# Patient Record
Sex: Male | Born: 1990 | Hispanic: No | Marital: Married | Smoking: Never smoker
Health system: Southern US, Community
[De-identification: ages and names within clinical notes are randomized; demographics above are authoritative.]

## PROBLEM LIST (undated history)

## (undated) DIAGNOSIS — Z8613 Personal history of malaria: Secondary | ICD-10-CM

## (undated) HISTORY — DX: Personal history of malaria: Z86.13

---

## 2019-11-20 DIAGNOSIS — Z0289 Encounter for other administrative examinations: Secondary | ICD-10-CM | POA: Insufficient documentation

## 2019-11-21 ENCOUNTER — Other Ambulatory Visit: Payer: Self-pay

## 2019-11-21 ENCOUNTER — Other Ambulatory Visit (HOSPITAL_COMMUNITY)
Admission: RE | Admit: 2019-11-21 | Discharge: 2019-11-21 | Disposition: A | Discharge status: Home / Self Care | Payer: Medicaid Other | Source: Ambulatory Visit | Attending: Family Medicine | Admitting: Family Medicine

## 2019-11-21 ENCOUNTER — Ambulatory Visit (INDEPENDENT_AMBULATORY_CARE_PROVIDER_SITE_OTHER): Payer: Medicaid Other | Admitting: Family Medicine

## 2019-11-21 VITALS — BP 130/82 | HR 57 | Ht 71.26 in | Wt 183.4 lb

## 2019-11-21 DIAGNOSIS — Z87898 Personal history of other specified conditions: Secondary | ICD-10-CM

## 2019-11-21 DIAGNOSIS — Z0289 Encounter for other administrative examinations: Secondary | ICD-10-CM | POA: Insufficient documentation

## 2019-11-21 DIAGNOSIS — Z8613 Personal history of malaria: Secondary | ICD-10-CM | POA: Diagnosis not present

## 2019-11-21 LAB — POCT URINALYSIS DIP (MANUAL ENTRY)
Bilirubin, UA: NEGATIVE
Blood, UA: NEGATIVE
Glucose, UA: NEGATIVE mg/dL
Ketones, POC UA: NEGATIVE mg/dL
Leukocytes, UA: NEGATIVE
Nitrite, UA: NEGATIVE
Protein Ur, POC: NEGATIVE mg/dL
Spec Grav, UA: 1.02 (ref 1.010–1.025)
Urobilinogen, UA: 0.2 E.U./dL
pH, UA: 7.5 (ref 5.0–8.0)

## 2019-11-21 NOTE — Patient Instructions (Addendum)
It was wonderful to see you today.  Please bring ALL of your medications with you to every visit.   Spleen ultrasound scheduled for:  Thursday November 4th at 9:30 AM [address] [place] [address] [place] [address]  Follow-up Appointment:  November 24th at 10:30 AM with Dr Miquel Dunn [address] [place] [address]   Thank you for choosing [facility].   Please call [phone] with any questions about today's appointment.  Please be sure to schedule follow up at the front  desk before you leave today.   Burley Saver, MD  Family Medicine

## 2019-11-21 NOTE — Progress Notes (Signed)
  Patient Name: Gerardo Caiazzo Date of Birth: 17-Nov-1990 Date of Visit: 11/21/19 PCP: No primary care provider on file.  Chief Complaint: refugee intake examination  The patient's preferred language is [place]. An interpreter was used for the entire visit.  Interpreter Name or ID: Rella Larve #[id number].    Subjective: Danne Vasek is a pleasant 29 y.o. presenting today for an initial refugee and immigrant clinic visit.    ROS: no headaches, no changes in vision or hearing, no chest pain, no shortness of breath, n cough, no abdominal pain, no diarrhea, no constipation, no blood in stools, no dysuria or hematuria, no fever or chills, no rashes or skin changes  PMH: History Chlamydia- s/p treatment azithromycin for himself and wife History of malaria  No past surgeries  PSH: Last sexually active in August in [place] with his wife Wife and son still in [place] Never smoker, quit drinking alcohol- used to just drink "a little," denies heavy drinking history Denies other drug use  FH: Denies family history of diseases or blood disorders  Current Medications:  No current medications  Refugee Information Number of Immediate Family Members: 4 Number of Immediate Family Members in [place]: 4 Date of Arrival: 10/05/19 Country of Birth: [facility] Country of Origin: [place] Location of Refugee Camp: Other Other Location of Refugee Camp:: [place] Duration in [place]: 11-15 years Reason for Leaving Home Country: Other Other Reason for Leaving Home Country:: problem of security Primary Language: Kinyar[place] Able to Read in Primary Language: Yes Able to Write in Primary Language: Yes Education: [employer] (finished high school) 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 Marital Status: Married (wife in [place]) Sexual Activity: No (last in August when living in [place]) Tuberculosis Screening Overseas: Negative Tuberculosis Screening Health Department:  (has been, do not have  results) Health Department Labs Completed: Yes History of Trauma: None Do You Feel Jumpy or Nervous?: No Are You Very Watchful or 'Super Alert'?: Yes   Date of Overseas Exam: 06/26/2019 Review of Overseas Exam: 11/21/19 Pre-Departure Treatment: Coartem, Albendazole, Praziquantel Overseas Vaccines Reviewed and Updated in Epic 11/21/19   Vitals:   11/21/19 1349  BP: 130/82  Pulse: (!) 57  SpO2: 99%   HEENT: Sclera anicteric. Dentition is normal . Appears well hydrated. Bilateral TM clear, MMM. Neck: Supple Cardiac: Regular rate and rhythm. Normal S1/S2. No murmurs, rubs, or gallops appreciated. Lungs: Clear bilaterally to ascultation.  Abdomen: Normoactive bowel sounds. No tenderness to deep or light palpation. No rebound or guarding. Splenomegaly not palpated Extremities: Warm, well perfused without edema.  Skin: no rashes or lesions aprpeciated Psych: Pleasant and appropriate  MSK: normal strength and sensation bilaterally, non-antalgic gait   Designated Partner Release signed with agency 11/21/19  Release of information signed for Health Department 11/21/19.   Return to care in 1 month in [facility] with resident physician and PCP Dr Miquel Dunn.   Vaccines: None today, has had one COVID vaccination, has appointment for the second one already  Bedside ultrasound for learning purposes by Dr Linwood Dibbles noted splenomegaly 12.4 cm, sending for formal ultrasound to assess as patient as a history of malaria.  Burley Saver MD [facility]

## 2019-11-22 LAB — URINE CYTOLOGY ANCILLARY ONLY
Chlamydia: NEGATIVE
Comment: NEGATIVE
Comment: NORMAL
Neisseria Gonorrhea: NEGATIVE

## 2019-11-24 LAB — HIV ANTIBODY (ROUTINE TESTING W REFLEX): HIV Screen 4th Generation wRfx: NONREACTIVE

## 2019-11-24 LAB — COMPREHENSIVE METABOLIC PANEL
ALT: 16 IU/L (ref 0–44)
AST: 19 IU/L (ref 0–40)
Albumin/Globulin Ratio: 1.6 (ref 1.2–2.2)
Albumin: 4.4 g/dL (ref 4.1–5.2)
Alkaline Phosphatase: 52 IU/L (ref 44–121)
BUN/Creatinine Ratio: 8 — ABNORMAL LOW (ref 9–20)
BUN: 6 mg/dL (ref 6–20)
Bilirubin Total: 0.3 mg/dL (ref 0.0–1.2)
CO2: 24 mmol/L (ref 20–29)
Calcium: 9.2 mg/dL (ref 8.7–10.2)
Chloride: 102 mmol/L (ref 96–106)
Creatinine, Ser: 0.78 mg/dL (ref 0.76–1.27)
GFR calc Af Amer: 143 mL/min/{1.73_m2} (ref >59)
GFR calc non Af Amer: 124 mL/min/{1.73_m2} (ref >59)
Globulin, Total: 2.8 g/dL (ref 1.5–4.5)
Glucose: 90 mg/dL (ref 65–99)
Potassium: 4.1 mmol/L (ref 3.5–5.2)
Sodium: 138 mmol/L (ref 134–144)
Total Protein: 7.2 g/dL (ref 6.0–8.5)

## 2019-11-24 LAB — CBC WITH DIFFERENTIAL/PLATELET
Basophils Absolute: 0 10*3/uL (ref 0.0–0.2)
Basos: 1 %
EOS (ABSOLUTE): 0.1 10*3/uL (ref 0.0–0.4)
Eos: 3 %
Hematocrit: 41.6 % (ref 37.5–51.0)
Hemoglobin: 14.6 g/dL (ref 13.0–17.7)
Immature Grans (Abs): 0 10*3/uL (ref 0.0–0.1)
Immature Granulocytes: 0 %
Lymphocytes Absolute: 1.8 10*3/uL (ref 0.7–3.1)
Lymphs: 61 %
MCH: 29.9 pg (ref 26.6–33.0)
MCHC: 35.1 g/dL (ref 31.5–35.7)
MCV: 85 fL (ref 79–97)
Monocytes Absolute: 0.3 10*3/uL (ref 0.1–0.9)
Monocytes: 10 %
Neutrophils Absolute: 0.8 10*3/uL — ABNORMAL LOW (ref 1.4–7.0)
Neutrophils: 25 %
Platelets: 316 10*3/uL (ref 150–450)
RBC: 4.88 x10E6/uL (ref 4.14–5.80)
RDW: 12.6 % (ref 11.6–15.4)
WBC: 3 10*3/uL — ABNORMAL LOW (ref 3.4–10.8)

## 2019-11-24 LAB — HCV INTERPRETATION

## 2019-11-24 LAB — RPR: RPR Ser Ql: NONREACTIVE

## 2019-11-24 LAB — LIPID PANEL
Chol/HDL Ratio: 4.5 ratio (ref 0.0–5.0)
Cholesterol, Total: 200 mg/dL — ABNORMAL HIGH (ref 100–199)
HDL: 44 mg/dL (ref >39)
LDL Chol Calc (NIH): 132 mg/dL — ABNORMAL HIGH (ref 0–99)
Triglycerides: 132 mg/dL (ref 0–149)
VLDL Cholesterol Cal: 24 mg/dL (ref 5–40)

## 2019-11-24 LAB — HEPATITIS B SURFACE ANTIGEN: Hepatitis B Surface Ag: NEGATIVE

## 2019-11-24 LAB — STRONGYLOIDES, AB, IGG: Strongyloides, Ab, IgG: NEGATIVE

## 2019-11-24 LAB — HEMOGLOBIN A1C
Est. average glucose Bld gHb Est-mCnc: 120 mg/dL
Hgb A1c MFr Bld: 5.8 % — ABNORMAL HIGH (ref 4.8–5.6)

## 2019-11-24 LAB — TSH: TSH: 2.02 u[IU]/mL (ref 0.450–4.500)

## 2019-11-24 LAB — HEPATITIS B SURFACE ANTIBODY, QUANTITATIVE: Hepatitis B Surf Ab Quant: >1000 m[IU]/mL (ref >9.9)

## 2019-11-24 LAB — HEPATITIS B CORE ANTIBODY, TOTAL: Hep B Core Total Ab: NEGATIVE

## 2019-11-24 LAB — HCV AB W REFLEX TO QUANT PCR: HCV Ab: <0.1 s/co ratio (ref 0.0–0.9)

## 2019-11-30 ENCOUNTER — Other Ambulatory Visit: Payer: Self-pay | Admitting: Family Medicine

## 2019-11-30 ENCOUNTER — Other Ambulatory Visit: Payer: Medicaid Other

## 2019-12-19 DIAGNOSIS — D709 Neutropenia, unspecified: Secondary | ICD-10-CM | POA: Insufficient documentation

## 2019-12-19 DIAGNOSIS — R7303 Prediabetes: Secondary | ICD-10-CM | POA: Insufficient documentation

## 2019-12-19 DIAGNOSIS — E7800 Pure hypercholesterolemia, unspecified: Secondary | ICD-10-CM | POA: Insufficient documentation

## 2019-12-19 NOTE — Progress Notes (Signed)
    SUBJECTIVE:   CHIEF COMPLAINT / HPI:  29 yo male who presents for follow-up from initial refugee visit.  Left ankle pain- Carrying a wood chair, fell on his leg, hurts to walk since yesterday. No fevers or chills or redness. Was just a scrape, did not bleed or have drainage. No previous injuries or fractures to that ankle.  Prediabetes- discussed A1c 5.8 today. No history of diabetes.  Moderate neutropenia- he denies fevers, chills, weight loss, night sweats. No other concerns, masses, lymphadenopathy.  Concern for splenomegaly- was late to ultrasound appointment and told he needed to reschedule.  Is also unsure if he received his second COVID vaccine.  PERTINENT  PMH / PSH:  Prediabetes Elevated LDL Moderate neutropenia Concern for splenomegaly  OBJECTIVE:   BP 120/80   Pulse (!) 58   Wt 184 lb (83.5 kg)   SpO2 98%   BMI 25.48 kg/m   General: A&O, NAD HEENT: No sign of trauma, EOM grossly intact, no LAD Cardiac: RRR, no m/r/g Respiratory: CTAB, normal WOB, no w/c/r GI: Soft, NTTP, non-distended  Extremities: NTTP, no peripheral edema. Left ankle with small abrasion over achilles tendon posteriorly, small amount of swelling, no ecchymoses, no bleeding, erythema, or discharge, no induration. Mild TTP around abrasion. No tenderness to palpation of medial or lateral malleoli, no tenderness of base of fifth metatarsal or navicular. Neuro: Normal gait, moves all four extremities appropriately. 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 Discussed and given address for Health Department for second COVID vaccine.  Billey Co, MD [facility]

## 2019-12-20 ENCOUNTER — Ambulatory Visit (HOSPITAL_COMMUNITY)
Admission: RE | Admit: 2019-12-20 | Discharge: 2019-12-20 | Disposition: A | Discharge status: Home / Self Care | Payer: Medicaid Other | Source: Ambulatory Visit | Attending: Family Medicine | Admitting: Family Medicine

## 2019-12-20 ENCOUNTER — Encounter: Payer: Self-pay | Admitting: Family Medicine

## 2019-12-20 ENCOUNTER — Ambulatory Visit: Payer: Medicaid Other | Admitting: Family Medicine

## 2019-12-20 ENCOUNTER — Other Ambulatory Visit: Payer: Self-pay

## 2019-12-20 DIAGNOSIS — M25572 Pain in left ankle and joints of left foot: Secondary | ICD-10-CM | POA: Insufficient documentation

## 2019-12-20 DIAGNOSIS — E7800 Pure hypercholesterolemia, unspecified: Secondary | ICD-10-CM | POA: Diagnosis not present

## 2019-12-20 DIAGNOSIS — R7303 Prediabetes: Secondary | ICD-10-CM

## 2019-12-20 DIAGNOSIS — D709 Neutropenia, unspecified: Secondary | ICD-10-CM | POA: Diagnosis not present

## 2019-12-20 NOTE — Assessment & Plan Note (Addendum)
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

## 2019-12-20 NOTE — Assessment & Plan Note (Signed)
-   no signs of infection, mild swelling, no ecchymoses, X-ray pending, will call with results - discussed PRN ice/Tylenol for pain control

## 2019-12-20 NOTE — Assessment & Plan Note (Signed)
-   discussed diet and exercise changes and implications/risk of progression to diabetes

## 2019-12-20 NOTE — Patient Instructions (Addendum)
It was wonderful to see you today.  Please bring ALL of your medications with you to every visit.   Today we talked about:  - We rechecked your lab work today, will call with results - We discussed diet and exercise changes - We scheduled you for an ankle X-ray, you can go to [facility] to get the X-ray today - We rescheduled your abdominal ultrasound - The Health Department at [address] can give you the second COVID vaccine [phone]   Thank you for choosing [facility].   Please call [phone] with any questions about today's appointment.  Please be sure to schedule follow up at the front  desk before you leave today.   Burley Saver, MD  Family Medicine

## 2019-12-20 NOTE — Assessment & Plan Note (Signed)
-   discussed diet & exercise changes

## 2019-12-21 LAB — CBC WITH DIFFERENTIAL/PLATELET
Basophils Absolute: 0 10*3/uL (ref 0.0–0.2)
Basos: 1 %
EOS (ABSOLUTE): 0.2 10*3/uL (ref 0.0–0.4)
Eos: 4 %
Hematocrit: 40.6 % (ref 37.5–51.0)
Hemoglobin: 14.3 g/dL (ref 13.0–17.7)
Immature Grans (Abs): 0 10*3/uL (ref 0.0–0.1)
Immature Granulocytes: 0 %
Lymphocytes Absolute: 2.2 10*3/uL (ref 0.7–3.1)
Lymphs: 54 %
MCH: 29.7 pg (ref 26.6–33.0)
MCHC: 35.2 g/dL (ref 31.5–35.7)
MCV: 84 fL (ref 79–97)
Monocytes Absolute: 0.5 10*3/uL (ref 0.1–0.9)
Monocytes: 11 %
Neutrophils Absolute: 1.2 10*3/uL — ABNORMAL LOW (ref 1.4–7.0)
Neutrophils: 30 %
Platelets: 314 10*3/uL (ref 150–450)
RBC: 4.81 x10E6/uL (ref 4.14–5.80)
RDW: 12.5 % (ref 11.6–15.4)
WBC: 4.1 10*3/uL (ref 3.4–10.8)

## 2020-01-10 ENCOUNTER — Other Ambulatory Visit: Payer: Medicaid Other

## 2020-01-17 ENCOUNTER — Other Ambulatory Visit: Payer: Medicaid Other

## 2020-01-18 ENCOUNTER — Other Ambulatory Visit: Payer: Medicaid Other

## 2020-02-01 NOTE — Progress Notes (Deleted)
    SUBJECTIVE:   CHIEF COMPLAINT / HPI:   ***  Concern for splenomegaly- based on initial refugee exam. [place] spleen scheduled for 02/08/20 at [facility]  Neutropenia- 800 at initial exam, repeat one month later 1200, mild  PERTINENT  PMH / PSH: ***  OBJECTIVE:   There were no vitals taken for this visit.  ***  ASSESSMENT/PLAN:   No problem-specific Assessment & Plan notes found for this encounter.     Billey Co, MD [facility]

## 2020-02-02 ENCOUNTER — Ambulatory Visit: Payer: Medicaid Other | Admitting: Family Medicine

## 2020-02-02 DIAGNOSIS — D700 Congenital agranulocytosis: Secondary | ICD-10-CM

## 2020-02-08 ENCOUNTER — Ambulatory Visit
Admission: RE | Admit: 2020-02-08 | Discharge: 2020-02-08 | Disposition: A | Discharge status: Home / Self Care | Payer: Medicaid Other | Source: Ambulatory Visit | Attending: Family Medicine | Admitting: Family Medicine

## 2020-02-08 ENCOUNTER — Other Ambulatory Visit: Payer: Medicaid Other

## 2020-02-08 DIAGNOSIS — Z8613 Personal history of malaria: Secondary | ICD-10-CM

## 2021-10-18 IMAGING — CR DG ANKLE COMPLETE 3+V*L*
3 series · 3 of 3 positions shown · non-contrast
Comparison: None.

CLINICAL DATA: Left ankle pain, trauma

EXAM:
LEFT ANKLE COMPLETE - 3+ VIEW

[ankle ap]
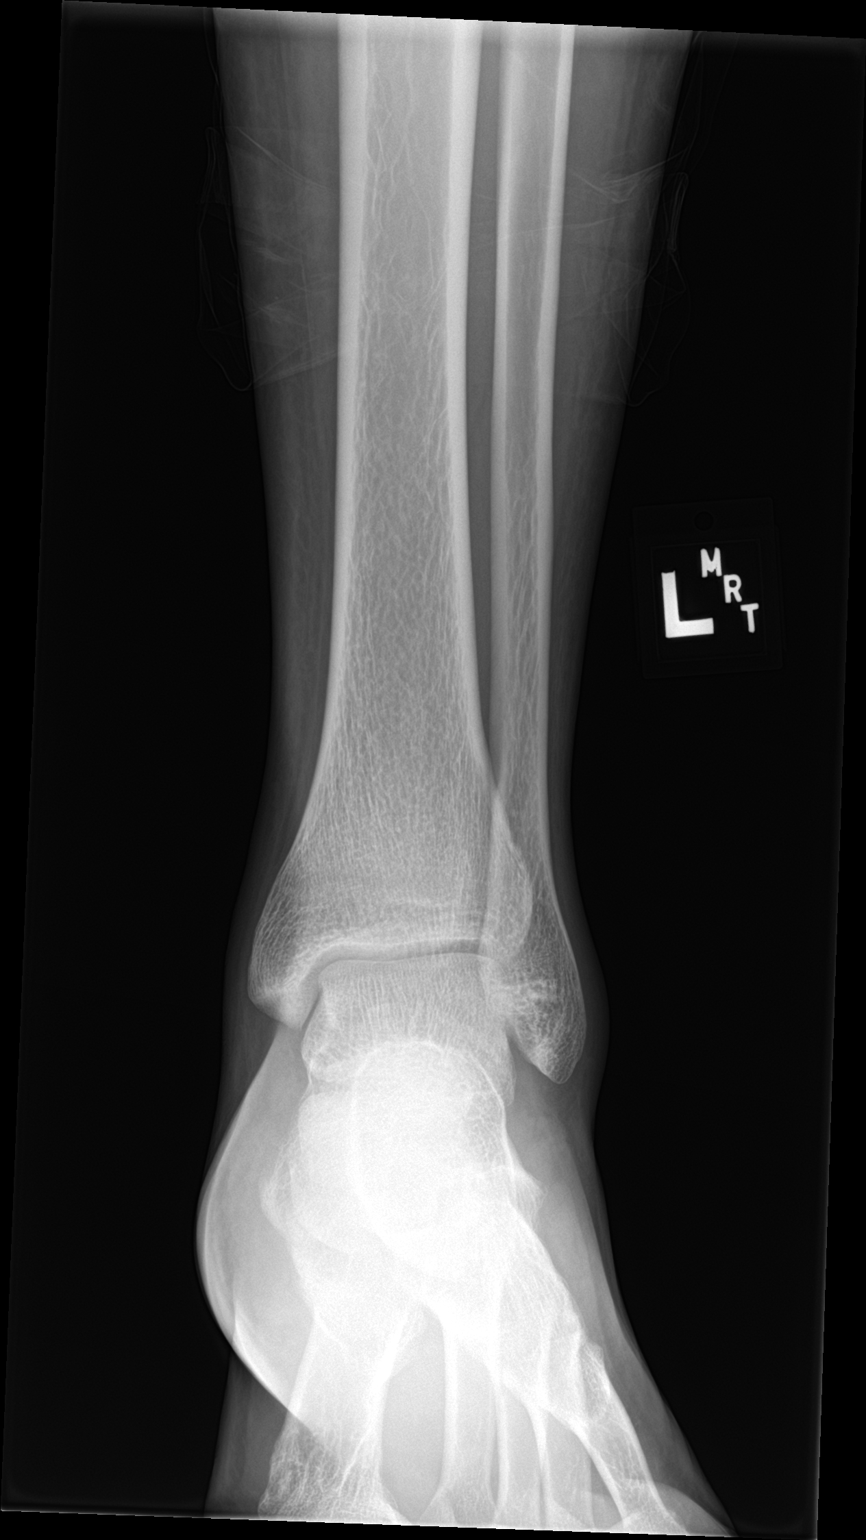

[ankle obl]
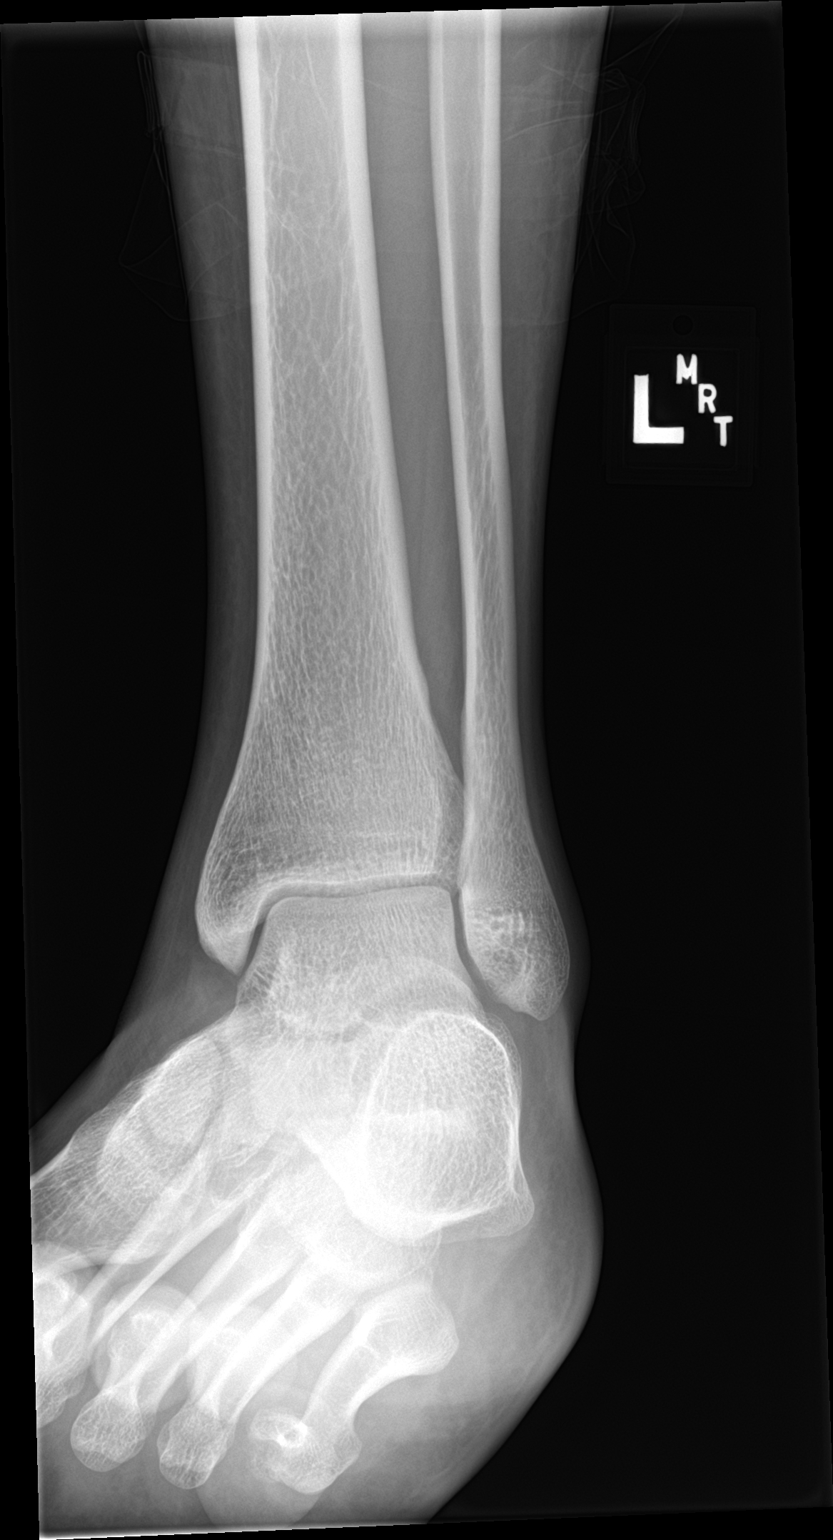

[ankle lat]
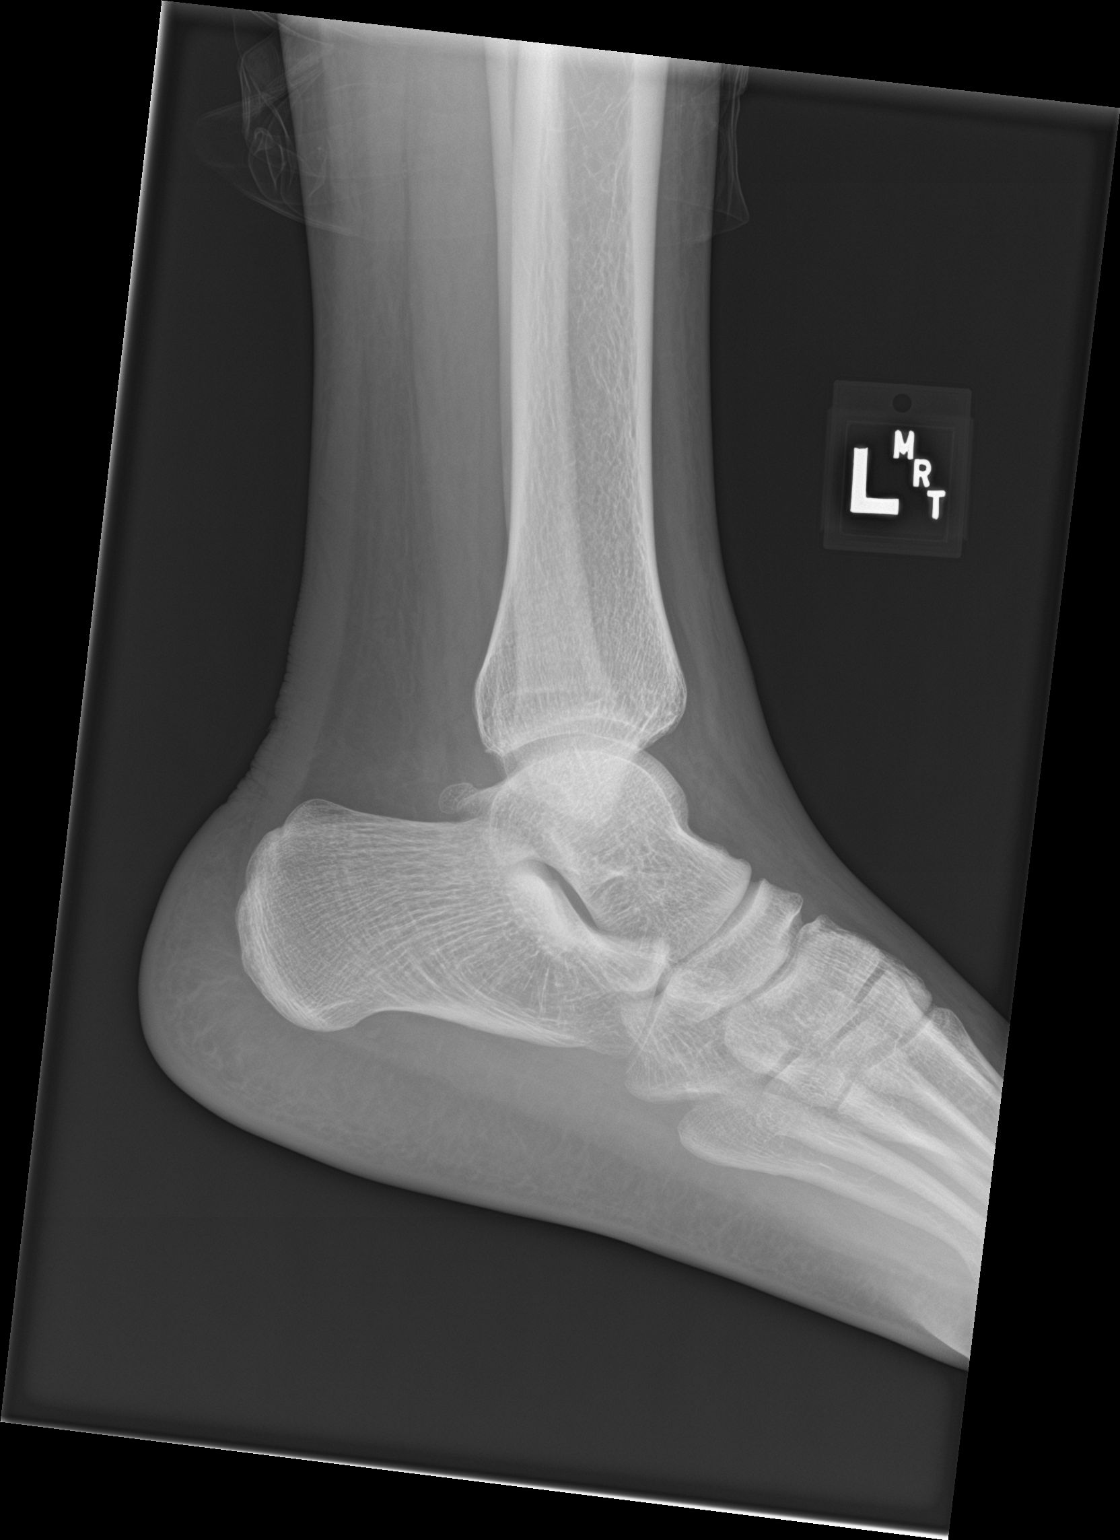

[3 of 3 positions shown; findings below may reference images not displayed]

FINDINGS: There is no evidence of fracture, dislocation, or joint effusion.
There is no evidence of arthropathy or other focal bone abnormality.
Soft tissues are unremarkable.
IMPRESSION: Negative.

## 2021-12-07 IMAGING — US US ABDOMEN LIMITED
1 series · 14 of 15 positions shown · non-contrast
Comparison: None.

CLINICAL DATA: 30-year-old male with possible splenomegaly and
history of malaria

EXAM:
ULTRASOUND ABDOMEN LIMITED

[Series 1: us abdomen limited · 0.23mm/px · 14 of 15 slices shown]
[im 1/15]
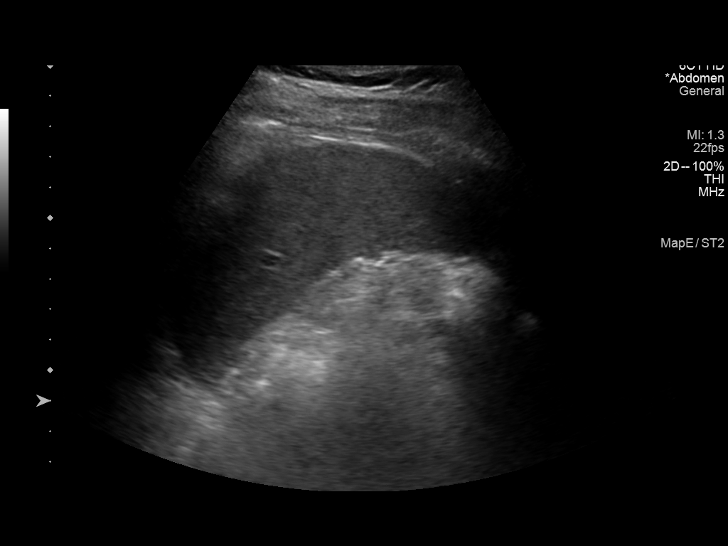
[im 2/15]
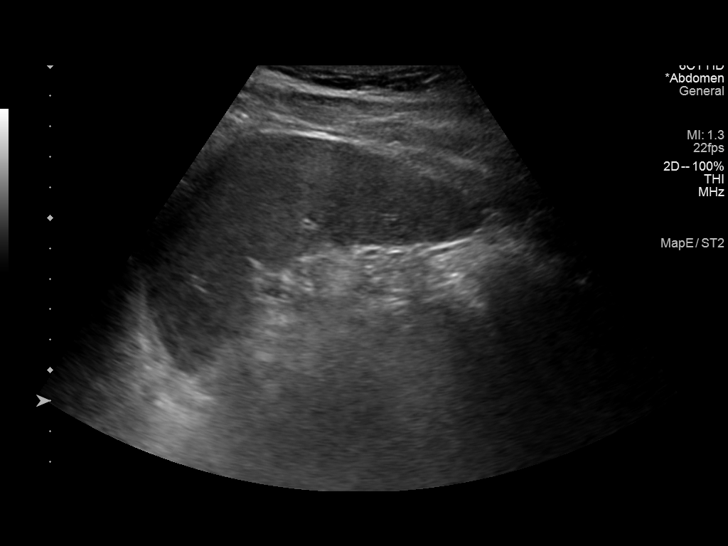
[im 3/15]
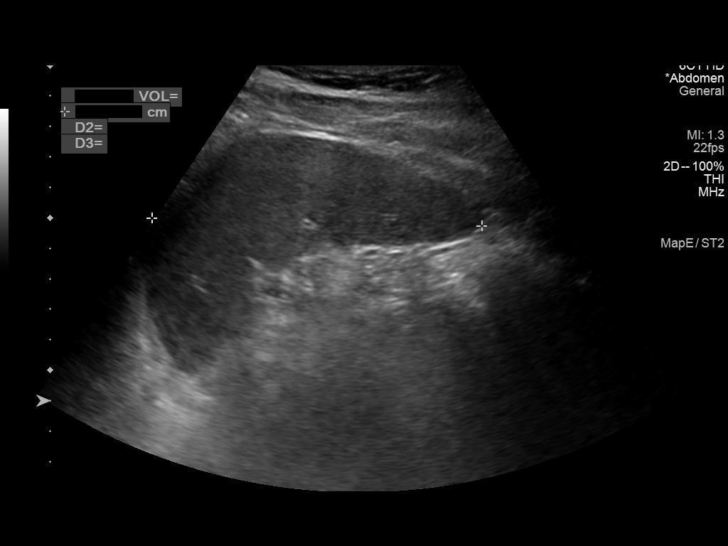
[im 4/15]
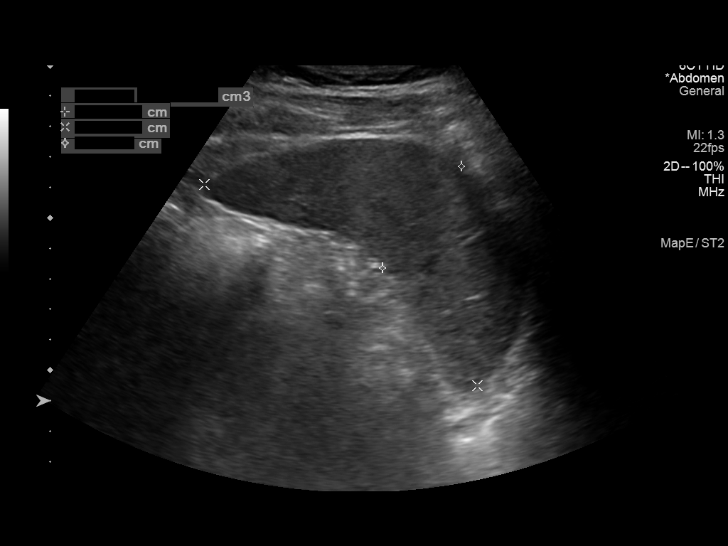
[im 5/15]
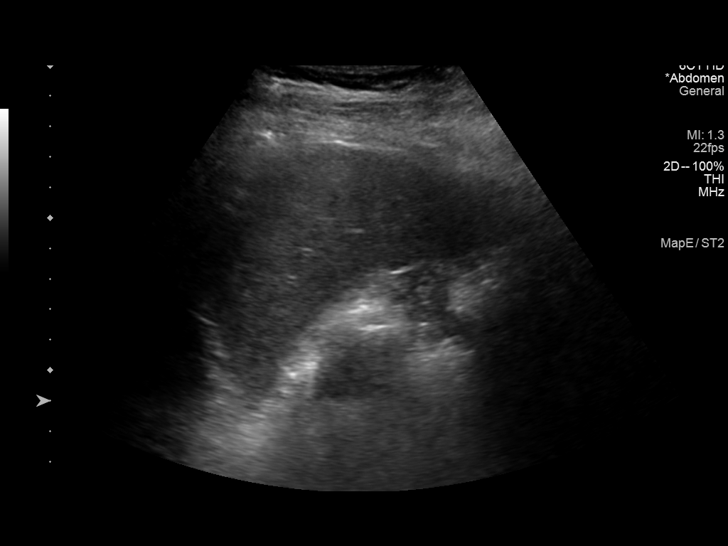
[im 6/15]
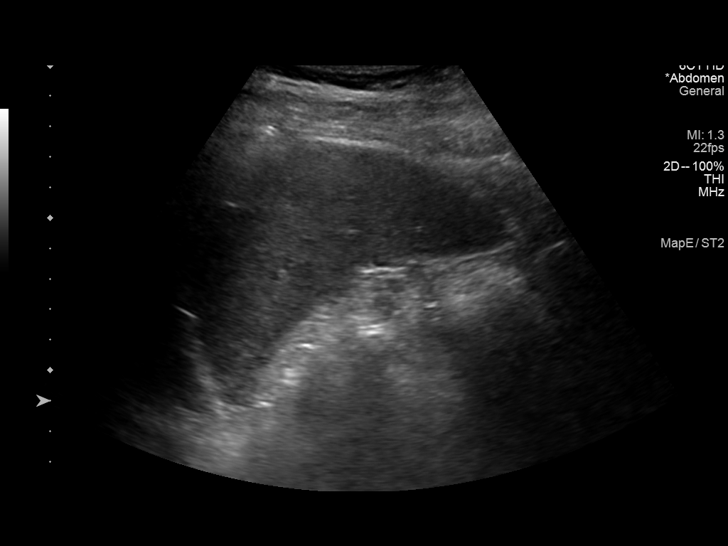
[im 7/15]
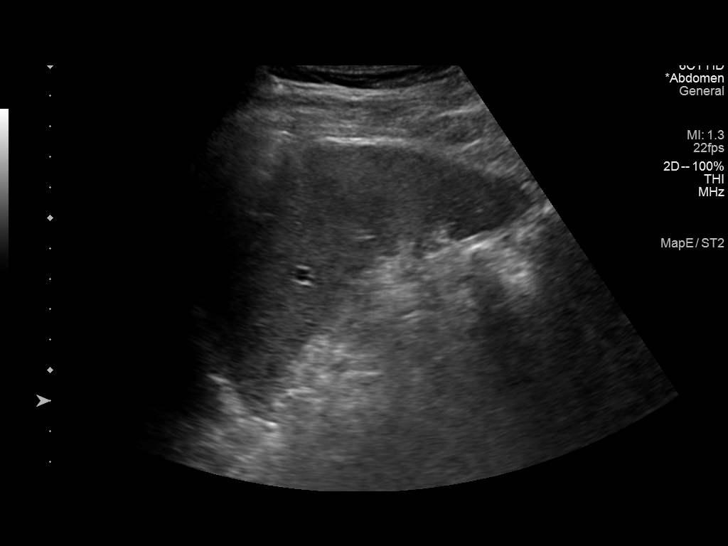
[im 9/15]
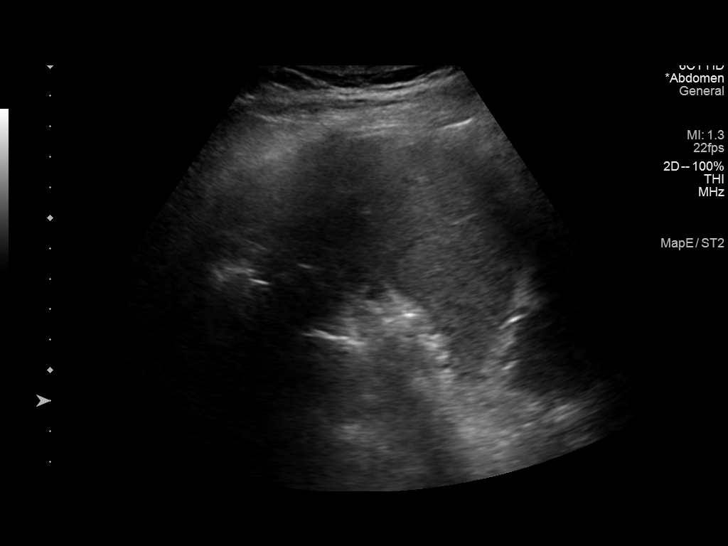
[im 10/15]
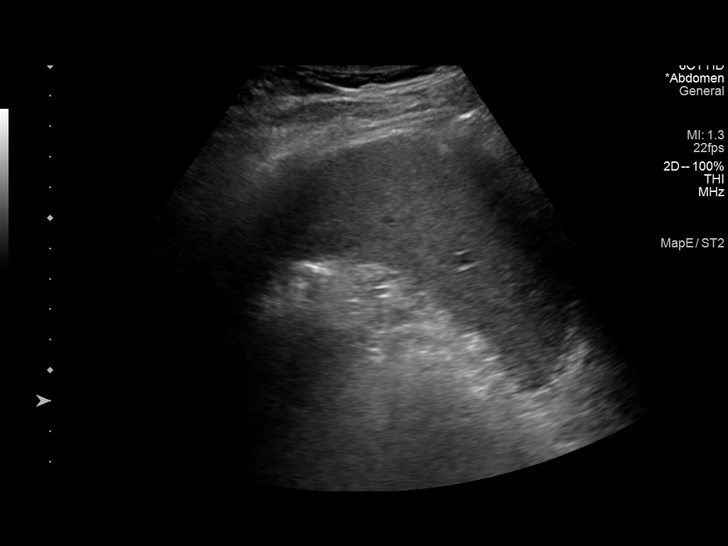
[im 11/15]
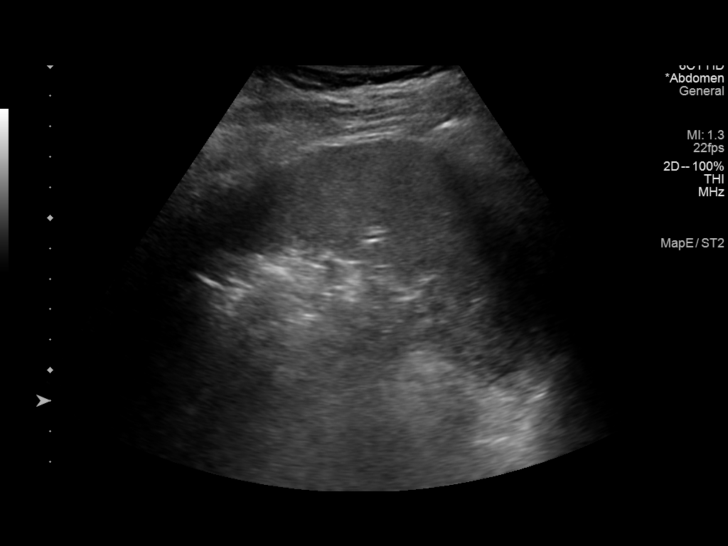
[im 12/15]
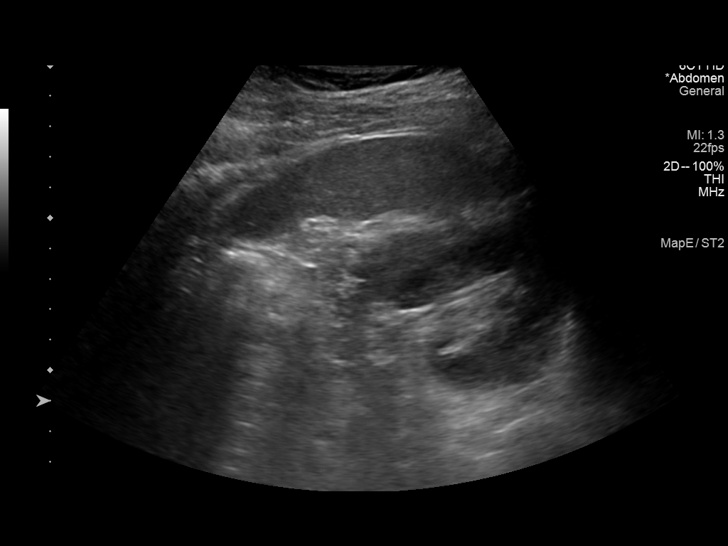
[im 13/15]
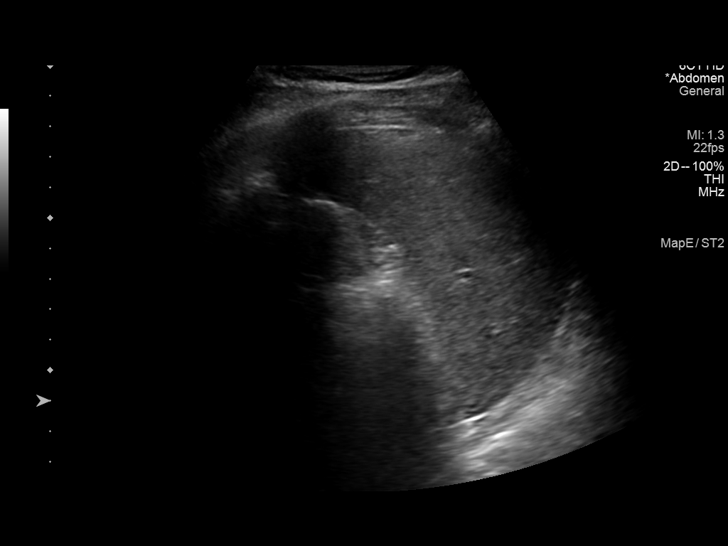
[im 14/15]
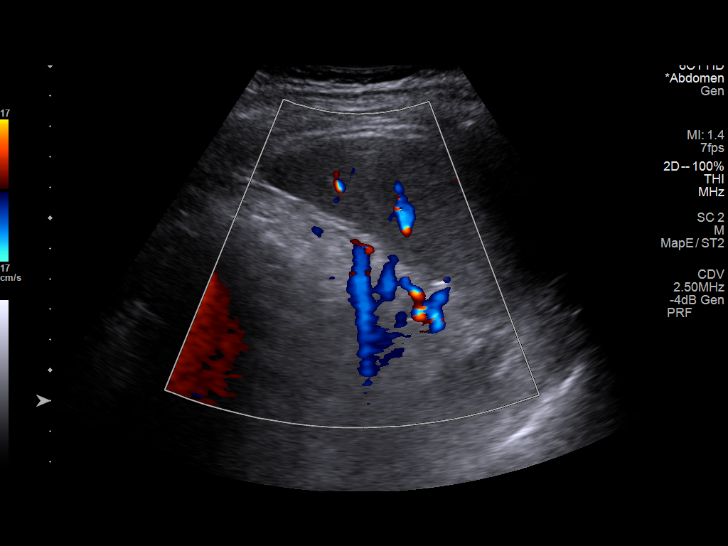
[im 15/15]
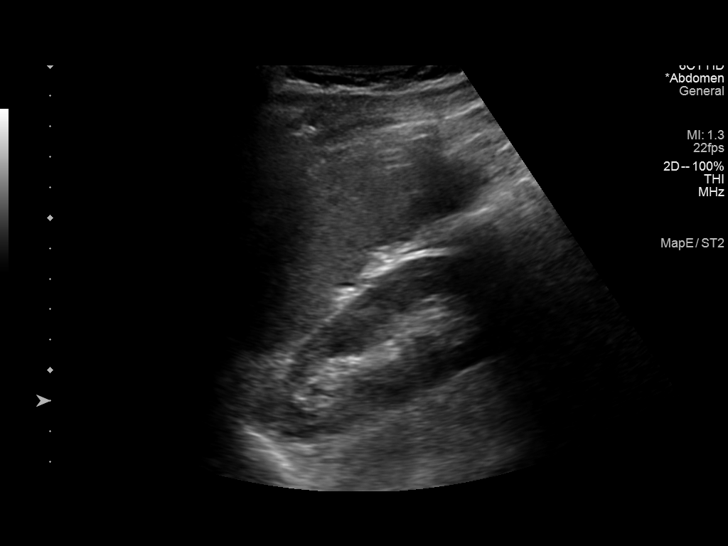

[14 of 15 positions shown; findings below may reference images not displayed]

FINDINGS: Limited ultrasound of the upper abdomen performed.

Spleen measures 10.8 cm x 11.1 cm x 4.2 cm, estimated volume of 266
cubic cm. Unremarkable echogenicity/echotexture with flow
maintained.
IMPRESSION: Relatively unremarkable sonographic survey of the spleen with a
maximum dimension 11 cm.

## 2022-09-29 ENCOUNTER — Ambulatory Visit: Payer: Self-pay

## 2022-10-09 ENCOUNTER — Ambulatory Visit (LOCAL_COMMUNITY_HEALTH_CENTER): Payer: Self-pay

## 2022-10-09 DIAGNOSIS — Z23 Encounter for immunization: Secondary | ICD-10-CM

## 2022-10-09 DIAGNOSIS — Z719 Counseling, unspecified: Secondary | ICD-10-CM

## 2022-10-09 NOTE — Progress Notes (Signed)
In nurse clinic for immunizations, as patient reports he is applying for [employer]. RN explained recommended vaccines and schedule to patient; patient agreed to receive vaccines. Voices no concerns. VIS reviewed and given to patient. Vaccines (Spikevax 12+, Twinrix, Polio, Flu) tolerated well. [facility] updated and copies given to patient.   Abagail Kitchens, RN
